# Patient Record
Sex: Female | Born: 1995 | Hispanic: Yes | Marital: Single | Smoking: Former smoker
Health system: Southern US, Community
[De-identification: ages and names within clinical notes are randomized; demographics above are authoritative.]

## PROBLEM LIST (undated history)

## (undated) DIAGNOSIS — J45909 Unspecified asthma, uncomplicated: Secondary | ICD-10-CM

## (undated) HISTORY — PX: NO PAST SURGERIES: SHX2092

---

## 2017-12-27 ENCOUNTER — Other Ambulatory Visit (HOSPITAL_COMMUNITY): Payer: Self-pay | Admitting: Obstetrics and Gynecology

## 2017-12-27 ENCOUNTER — Encounter (HOSPITAL_COMMUNITY): Payer: Self-pay

## 2017-12-27 DIAGNOSIS — O283 Abnormal ultrasonic finding on antenatal screening of mother: Secondary | ICD-10-CM

## 2017-12-27 DIAGNOSIS — Z3A12 12 weeks gestation of pregnancy: Secondary | ICD-10-CM

## 2017-12-28 ENCOUNTER — Ambulatory Visit (HOSPITAL_COMMUNITY)
Admission: RE | Admit: 2017-12-28 | Discharge: 2017-12-28 | Disposition: A | Discharge status: Home / Self Care | Payer: Medicaid Other | Source: Ambulatory Visit | Attending: Obstetrics and Gynecology | Admitting: Obstetrics and Gynecology

## 2017-12-28 ENCOUNTER — Other Ambulatory Visit: Payer: Self-pay

## 2017-12-28 ENCOUNTER — Encounter (HOSPITAL_COMMUNITY): Payer: Self-pay

## 2017-12-28 DIAGNOSIS — Z3A12 12 weeks gestation of pregnancy: Secondary | ICD-10-CM | POA: Insufficient documentation

## 2017-12-28 DIAGNOSIS — O283 Abnormal ultrasonic finding on antenatal screening of mother: Secondary | ICD-10-CM

## 2017-12-28 DIAGNOSIS — O289 Unspecified abnormal findings on antenatal screening of mother: Secondary | ICD-10-CM | POA: Diagnosis present

## 2017-12-28 HISTORY — DX: Unspecified asthma, uncomplicated: J45.909

## 2017-12-28 NOTE — Progress Notes (Signed)
Genetic Counseling  High-Risk Gestation Note  Appointment Date:  12/28/2017 Referred By: Marylen Ponto, DO Date of Birth:  28-Dec-1995 Partner:  Tabitha Henry   Pregnancy History: [id number] Estimated Date of Delivery: 07/08/18 Estimated Gestational Age: [redacted]w[redacted]d Attending: Ledon Snare, MD   Ms. Tabitha Henry and her partner, Mr. Tabitha Henry, were seen for genetic counseling because of increased nuchal translucency on ultrasound.     In summary:  Reviewed ultrasound findings and the association with fetal aneuploidy  NT measurement approximately 4.2 mm at [facility] of 68  Discussed significance of prior screening for fetal aneuploidy  NIPS for aneuploidy (InformaSeq) drawn through OB office on 12/25/17- currently pending  Discussed additional associations with NT including structural defect, single gene conditions, infections, and normal variant  Offered additional screening  NIPS for single gene conditions (Vistara)- couple elected to pursue this at today's visit  Expanded carrier screening- patient elected to pursue expanded panel through [facility] laboratory today  Detailed ultrasound- scheduled 02/08/18  Fetal Echocardiogram- not scheduled at this [facility]  Discussed option of diagnostic testing  [facility]- declined  Amniocentesis- declined today but may consider pending results of screening tests  Reviewed family history concerns  Ms. Tabitha Henry was referred given that increased nuchal translucency was observed in her primary OB office on 12/25/17, and noninvasive prenatal screening (NIPS)/prenatal cell free DNA testing, specifically InformaSeq, was drawn at that [facility].   Ultrasound was performed today and also visualized increased nuchal translucency measurement of 4.2 mm at CRL of 62 mm. No evidence of hydrops on today's exam. Ultrasound today otherwise within normal limits. Complete ultrasound report under separate cover.   We discussed that the fetal NT refers to a  fluid filled space between the skin and soft tissues behind the cervical spine. This space is traditionally measurable between 11 and 13.[redacted] weeks gestation and is considered enlarged when the measurement is equal to or greater than the 95th percentile for the gestational age. This couple was counseled regarding the various common etiologies for an enlarged NT including: aneuploidy, single gene conditions, cardiac or great vessel abnormalities, lymphatic system failure, decreased fetal movement, and fetal anemia.   We reviewed chromosomes, nondisjunction, and the common features and prognoses of Down Henry and other aneuploidies. Considering Ms. Tabitha Henry's age, an NT of 4.2 mm and a CRL of 62 mm, the risk for fetal aneuploidy is estimated to be approximately 11%. Results of the patient's NIPS are currently pending. We reviewed the methodology of NIPS, the conditions for which InformaSeq assesses and the sensitivity and specificity of each. They understand that while highly sensitive and specific, it is not considered to be diagnostic and does not assess for all chromosome conditions. We reviewed the benefits and limitations of this screen. We also discussed the chance for other underlying chromosome aberrations associated with increased NT, such as deletions or duplications.   They were also counseled regarding diagnostic testing via [facility] and amniocentesis for karyotype and chromosome microarray analysis. They were counseled that microarray analysis is a molecular based technique in which a test sample of DNA (fetal) is compared to a reference (normal) genome in order to determine if the test sample has any extra or missing genetic information.  Microarray analysis allows for the detection of genetic deletions and duplications that are 100 times smaller than those identified by routine chromosome analysis.They were counseled that chromosome microarray analysis detects a genomic copy number variant  (CNV) in ~6% of fetuses with an abnormal ultrasound finding(s) and a normal  karyotype; however, the significance of that CNV in some cases may not be known. We reviewed the associated 1 in 100 risks for complications for [facility] and associated 1 in 300-500 risk for complications for amniocentesis, including spontaneous pregnancy loss. Ms. Tabitha Henry. However, the couple indicated that they would consider pursuing amniocentesis pending results of screening tests in the pregnancy.   We discussed the possible results that the tests might provide including: positive, negative, unanticipated, and no result. Finally, they were counseled regarding the cost of each option and potential out of pocket expenses.   In addition, we discussed that an NT of 4.2 mm is associated with an approximate 3% risk for a fetal cardiac anomaly. We discussed the options of a fetal echocardiogram and detailed anatomy ultrasound as methods to evaluate the fetal heart. Detailed ultrasound is scheduled for 02/08/18. Fetal echocardiogram was not yet ordered/scheduled.    We also discussed single gene conditions and various patterns of inheritance including autosomal dominant (either inherited or de novo) and autosomal recessive. They were counseled that an increased NT is associated with an increased chance for specific single gene conditions including Noonan spectrum disorders, skeletal dysplasias, Tabitha Henry, Tabitha Henry, and many others. We discussed that these conditions are not routinely tested for prenatally unless ultrasound findings or family history significantly increase the suspicion of a specific single gene disorder. However, there is new noninvasive prenatal screening (NIPS) technology which assesses for specific alterations in 30 single genes, including the genes associated with Noonan Henry, some skeletal dysplasias, and Tabitha Henry. We discussed that this testing,  marketed as [facility] merchandiserVistara through [facility] laboratory, is available. We reviewed the methodology and that both maternal and paternal sample is required for this testing to be performed. We reviewed the benefits and limitations. They understand that many of the features of these conditions can be detected by detailed ultrasound during the second trimester; however, ultrasound cannot definitively diagnose or rule out these conditions. After careful consideration, Ms. Tabitha Henry elected to proceed with Vistara at today's visit. Turnaround Henry is approximately 2-3 weeks.   Regarding assessing for autosomal recessive and some X-linked single gene conditions, we discussed the option of expanded carrier screening. Regarding single gene conditions, we discussed autosomal dominant, autosomal recessive, and X-linked conditions. We discussed that prenatal screening and testing is not typically available for all single gene conditions associated with increased nuchal translucency. We discussed the option of carrier screening for a select panel of autosomal recessive and some X-linked conditions, some of which but not all can have increased NT as an associated feature. ACOG currently recommends that all patients be offered carrier screening for cystic fibrosis, spinal muscular atrophy and hemoglobinopathies. In addition, they were counseled that there are a variety of genetic screening laboratories that have pan-ethnic, or expanded, carrier screening panels, which evaluate carrier status for a wide range of genetic conditions. Some of these conditions are severe and actionable, but also rare; others occur more commonly, but are less severe. We discussed that testing options range from screening for a single condition to panels of more than 200 autosomal or X-linked genetic conditions. The prevalence of each condition varies (and often varies with ethnicity). Thus the couples' background risk to be a carrier for each of these  various conditions would range, and in some cases be very low or unknown. We reviewed that a negative carrier screen would thus reduce, but not eliminate the chance to be a carrier for  these conditions. For some conditions included on specific pan-ethnic carrier screening panels, the phenotype may not yet be well defined. For the majority of conditions on pan-ethnic carrier screening panels, identification of carrier status is not expected to be associated with medical features for the carrier; However, there are currently few exceptions where carrier status has been shown to increase the chance for certain medical concerns. We reviewed that in the event that one partner is found to be a carrier for one or more conditions, carrier screening would be available to the partner for those conditions. We discussed the risks, benefits, and limitations of carrier screening with the couple. After thoughtful consideration of their options, Ms. Katielynn Horan elected to pursue expanded pan-ethnic carrier screening (including ACOG recommended panel) at this [facility] through [facility] laboratory; the panel includes 175 single gene conditions.   We also discussed that an increased NT value can be a normal variant, which can resolve during the pregnancy. They were counseled that the fetal prognosis depends on the underlying etiology of the enlarged NT and further anticipatory guidance can be provided if a diagnosis is discovered. They understand that the legal limit for TAB in [place] is [redacted] weeks gestation.   Both family histories were reviewed and found to be noncontributory for birth defects, intellectual disability, and known genetic conditions. The couple have an 32 month old daughter together, who is reportedly healthy. The couple each reported [place] ancestry. Consanguinity was denied for the couple. Further genetic counseling is warranted if more information is obtained.  Ms. Rosangelica Pevehouse denied exposure to  environmental toxins or chemical agents. She denied the use of alcohol, tobacco or street drugs. She reported that she had the flu at approximately one month ago, which would be approximately 7-[redacted] weeks gestation. She had a chest x ray at that Henry, with lead shield over her pelvis given that they were aware of the pregnancy. She is unsure of the degree to which her fever went to, and she reported taking Tamiflu.  I counseled this couple regarding the above risks and available options. Most of the counseling was provided by Andrez Grime, [employer] genetic counseling student, under my direct supervision. The approximate face-to-face Henry with the genetic counselor was 45 minutes.   Quinn Plowman, MS Certified Genetic Counselor 12/28/2017

## 2017-12-31 ENCOUNTER — Encounter (HOSPITAL_COMMUNITY): Payer: Self-pay

## 2017-12-31 ENCOUNTER — Other Ambulatory Visit (HOSPITAL_COMMUNITY): Payer: Self-pay | Admitting: *Deleted

## 2017-12-31 DIAGNOSIS — O283 Abnormal ultrasonic finding on antenatal screening of mother: Secondary | ICD-10-CM

## 2018-01-02 ENCOUNTER — Other Ambulatory Visit: Payer: Self-pay

## 2018-01-17 ENCOUNTER — Telehealth (HOSPITAL_COMMUNITY): Payer: Self-pay | Admitting: MS"

## 2018-01-17 NOTE — Telephone Encounter (Signed)
Called Lorane Gell Terrisa Curfman to discuss her prenatal cell free single gene DNA screening results. Ms. ,Chevonne Bostrom and her partner, Geralyn Flash, had Vistara screening through [place] laboratories. Screening was offered because of increased nuchal translucency on ultrasound. The patient was identified by name and DOB. We reviewed that no known pathogenic variants were detected in any of the genes for which screening was performed. We reviewed that the Vistara sequencing panel contains the coding regions of 30 genes, which follow an autosomal dominant or X-linked pattern of inheritance and typically occur due to de novo gene mutations. The detection rates for these conditions vary depending upon the specific condition but range from 43% to 96%. Therefore, this screening does not identify all new dominant gene mutations and a residual risk remains. We discussed that additional screening is available via detailed ultrasound, which the patient is scheduled to have.   Ms. Zeniyah Peaster also had expanded carrier screening through [facility]. We reviewed that the results are negative for all of the conditions for which analysis was performed, with the exception of Pendred syndrome.  This indicates that she does not have a detectable gene alteration in any of the genes for which analysis was performed, with the exception of [id number]. We discussed this condition briefly.   She was counseled that Pendred syndrome is an autosomal recessive condition characterized by congenital severe to profound deafness and increased risk for goiter development. We discussed that this condition would not be expected to be related to the ultrasound findings in the current pregnancy. Prior to carrier screening for her partner, the current pregnancy has an approximate 1 in 280 risk for Pendred syndrome, assuming the general population carrier frequency for [place]. We discussed that carrier screening is  available to Mr. Wendee Beavers, if desired. Ms. Venna Berberich plans to discuss this information further and will contact us if she desires [place] to facilitate carrier screening for him.   We reviewed that carrier screening does not detect all carriers of all of these conditions, but a normal result significantly decreases the likelihood of being a carrier, and therefore, the overall reproductive risk. We reviewed that [facility] sequences most of the genes, which is associated with a high detection rate for carriers, thus a negative screen is very reassuring. All questions were answered to her satisfaction, she was encouraged to call with additional questions or concerns.   Chipper Oman, MS Insurance risk surveyor

## 2018-01-17 NOTE — Telephone Encounter (Signed)
Attempted to contact patient regarding results of single gene noninvasive prenatal screening (Vistara) and expanded carrier screening. Patient did not answer and does not have voice mailbox set up. Unable to leave message for patient to call back.   Tabitha BraunKaren Shooter Tangen 01/17/2018 11:50 AM

## 2018-02-08 ENCOUNTER — Other Ambulatory Visit (HOSPITAL_COMMUNITY): Payer: Self-pay | Admitting: Obstetrics and Gynecology

## 2018-02-08 ENCOUNTER — Ambulatory Visit (HOSPITAL_COMMUNITY)
Admission: RE | Admit: 2018-02-08 | Discharge: 2018-02-08 | Disposition: A | Discharge status: Home / Self Care | Payer: Medicaid Other | Source: Ambulatory Visit | Attending: Obstetrics and Gynecology | Admitting: Obstetrics and Gynecology

## 2018-02-08 ENCOUNTER — Encounter (HOSPITAL_COMMUNITY): Payer: Self-pay

## 2018-02-08 DIAGNOSIS — O289 Unspecified abnormal findings on antenatal screening of mother: Secondary | ICD-10-CM | POA: Insufficient documentation

## 2018-02-08 DIAGNOSIS — Z3689 Encounter for other specified antenatal screening: Secondary | ICD-10-CM

## 2018-02-08 DIAGNOSIS — Z3A18 18 weeks gestation of pregnancy: Secondary | ICD-10-CM | POA: Diagnosis not present

## 2018-02-08 DIAGNOSIS — O321XX0 Maternal care for breech presentation, not applicable or unspecified: Secondary | ICD-10-CM | POA: Diagnosis not present

## 2018-02-08 DIAGNOSIS — O283 Abnormal ultrasonic finding on antenatal screening of mother: Secondary | ICD-10-CM

## 2018-02-11 ENCOUNTER — Other Ambulatory Visit (HOSPITAL_COMMUNITY): Payer: Self-pay | Admitting: *Deleted

## 2018-02-11 DIAGNOSIS — Z362 Encounter for other antenatal screening follow-up: Secondary | ICD-10-CM

## 2018-03-08 ENCOUNTER — Ambulatory Visit (HOSPITAL_COMMUNITY)
Admission: RE | Admit: 2018-03-08 | Discharge: 2018-03-08 | Disposition: A | Discharge status: Home / Self Care | Payer: Medicaid Other | Source: Ambulatory Visit | Attending: Obstetrics and Gynecology | Admitting: Obstetrics and Gynecology

## 2018-03-08 ENCOUNTER — Encounter (HOSPITAL_COMMUNITY): Payer: Self-pay

## 2018-03-08 ENCOUNTER — Other Ambulatory Visit (HOSPITAL_COMMUNITY): Payer: Self-pay | Admitting: Obstetrics and Gynecology

## 2018-03-08 DIAGNOSIS — Z3A22 22 weeks gestation of pregnancy: Secondary | ICD-10-CM | POA: Insufficient documentation

## 2018-03-08 DIAGNOSIS — O289 Unspecified abnormal findings on antenatal screening of mother: Secondary | ICD-10-CM

## 2018-03-08 DIAGNOSIS — Z362 Encounter for other antenatal screening follow-up: Secondary | ICD-10-CM | POA: Insufficient documentation

## 2018-03-08 DIAGNOSIS — O283 Abnormal ultrasonic finding on antenatal screening of mother: Secondary | ICD-10-CM

## 2018-03-11 ENCOUNTER — Other Ambulatory Visit (HOSPITAL_COMMUNITY): Payer: Self-pay | Admitting: *Deleted

## 2018-03-11 DIAGNOSIS — O283 Abnormal ultrasonic finding on antenatal screening of mother: Secondary | ICD-10-CM

## 2018-03-28 ENCOUNTER — Encounter (HOSPITAL_COMMUNITY): Payer: Self-pay

## 2018-04-12 ENCOUNTER — Ambulatory Visit (HOSPITAL_COMMUNITY)
Admission: RE | Admit: 2018-04-12 | Discharge: 2018-04-12 | Disposition: A | Discharge status: Home / Self Care | Payer: Medicaid Other | Source: Ambulatory Visit | Attending: Obstetrics and Gynecology | Admitting: Obstetrics and Gynecology

## 2018-04-24 ENCOUNTER — Ambulatory Visit (HOSPITAL_COMMUNITY)
Admission: RE | Admit: 2018-04-24 | Discharge: 2018-04-24 | Disposition: A | Discharge status: Home / Self Care | Payer: Medicaid Other | Source: Ambulatory Visit | Attending: Obstetrics and Gynecology | Admitting: Obstetrics and Gynecology

## 2018-04-24 ENCOUNTER — Encounter (HOSPITAL_COMMUNITY): Payer: Self-pay

## 2018-04-24 DIAGNOSIS — O283 Abnormal ultrasonic finding on antenatal screening of mother: Secondary | ICD-10-CM | POA: Insufficient documentation

## 2018-09-30 ENCOUNTER — Encounter (HOSPITAL_COMMUNITY): Payer: Self-pay

## 2019-03-12 IMAGING — US US MFM OB FOLLOW-UP
1 series · 14 of 28 positions shown · non-contrast
Comparison: none

[Series 1: us mfm ob follow-up · 14 of 73 slices shown]
[im 3/73]
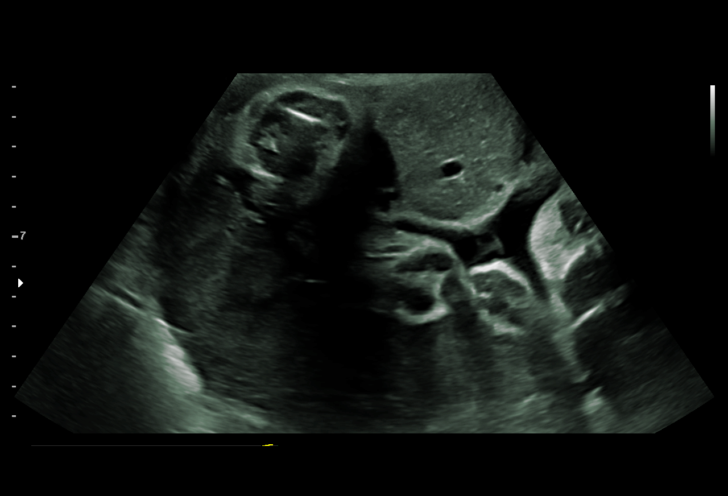
[im 9/73]
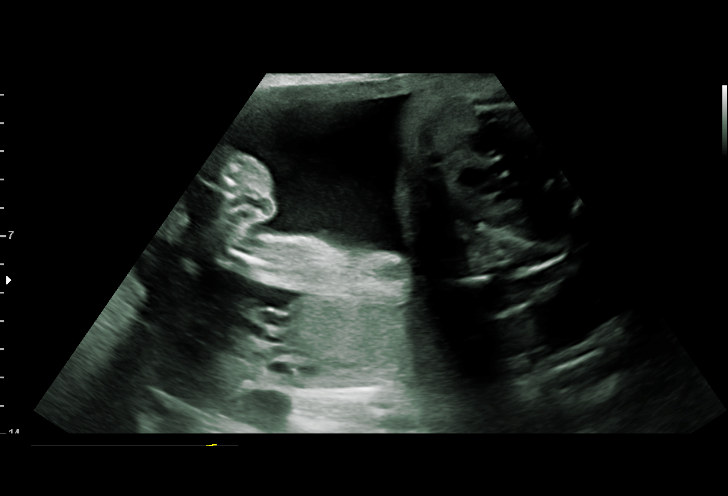
[im 14/73]
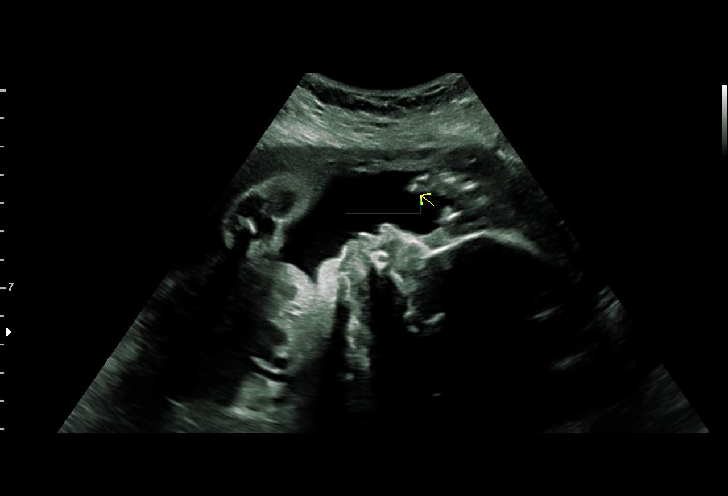
[im 19/73]
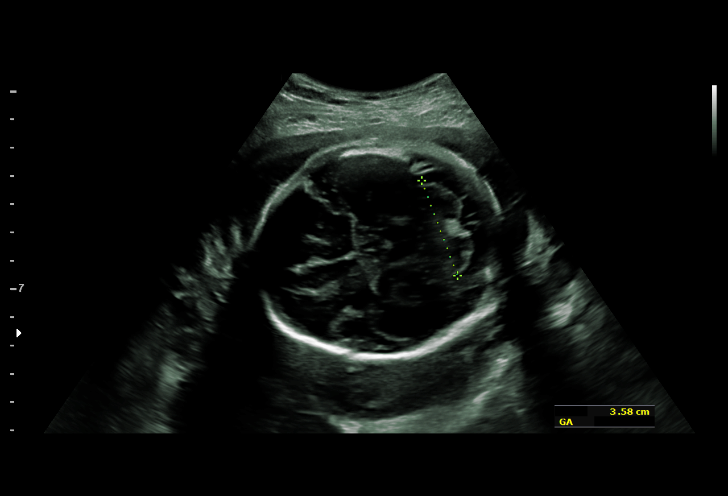
[im 25/73]
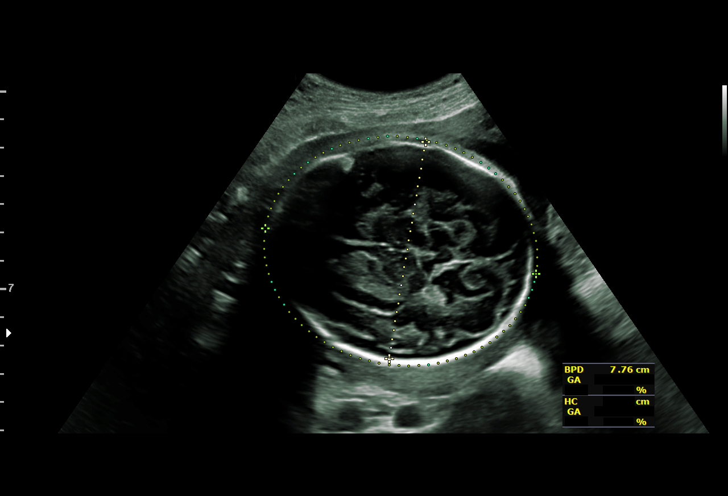
[im 30/73]
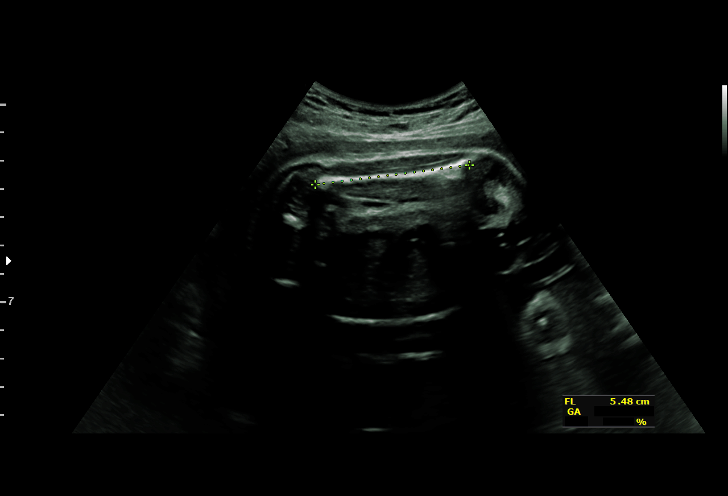
[im 35/73]
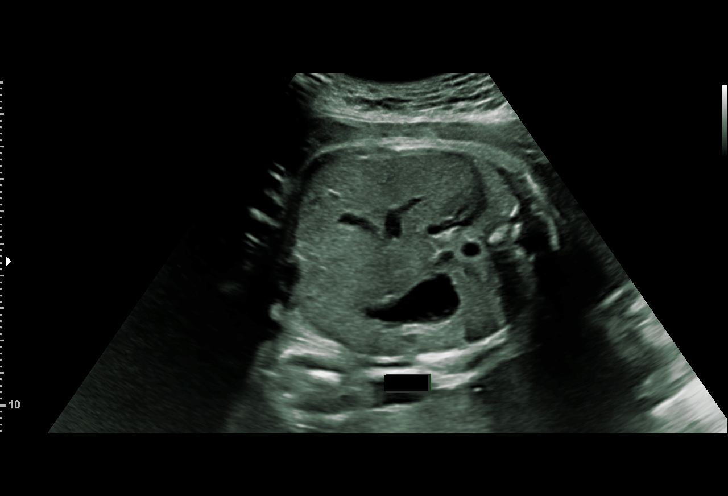
[im 41/73]
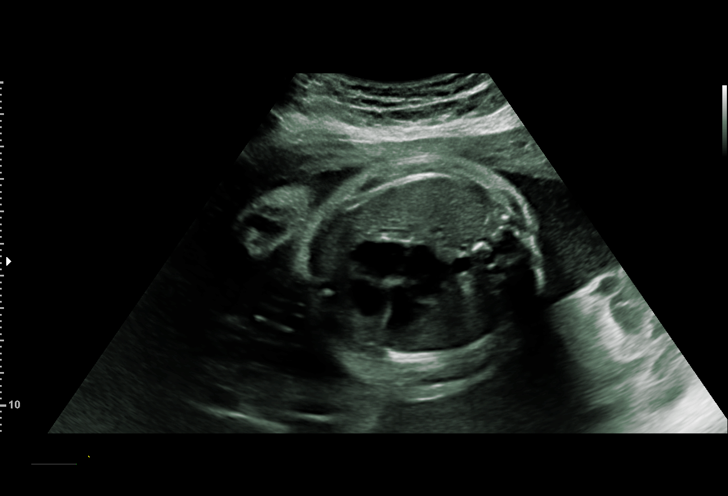
[im 46/73]
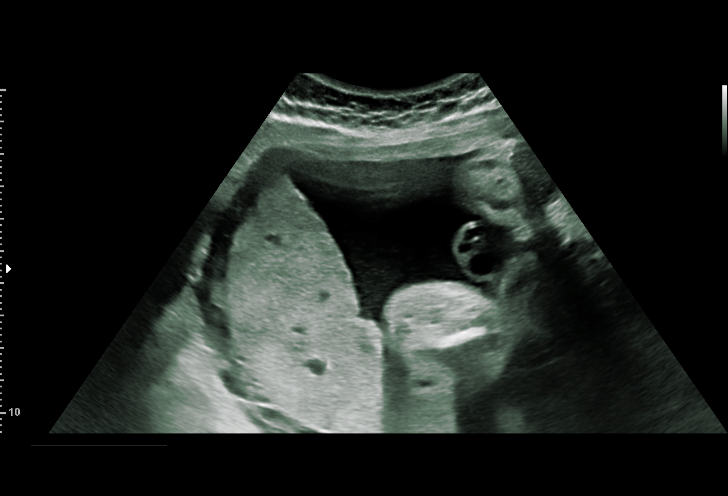
[im 51/73]
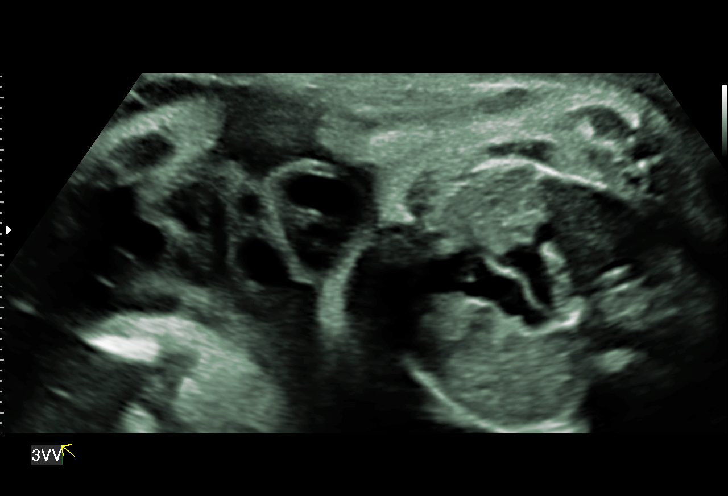
[im 57/73]
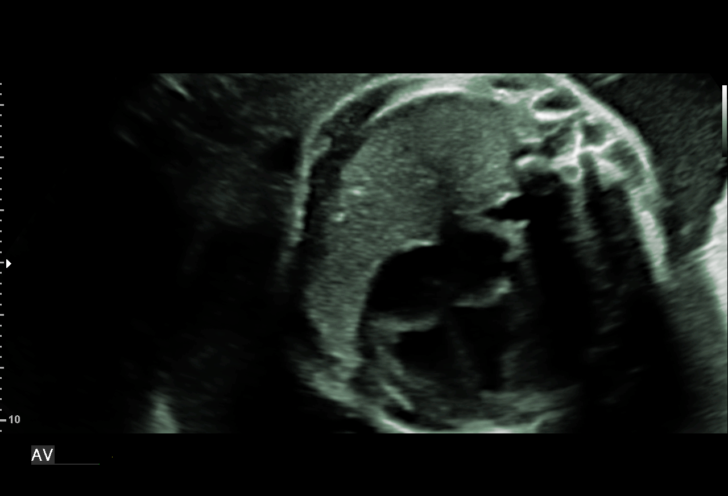
[im 62/73]
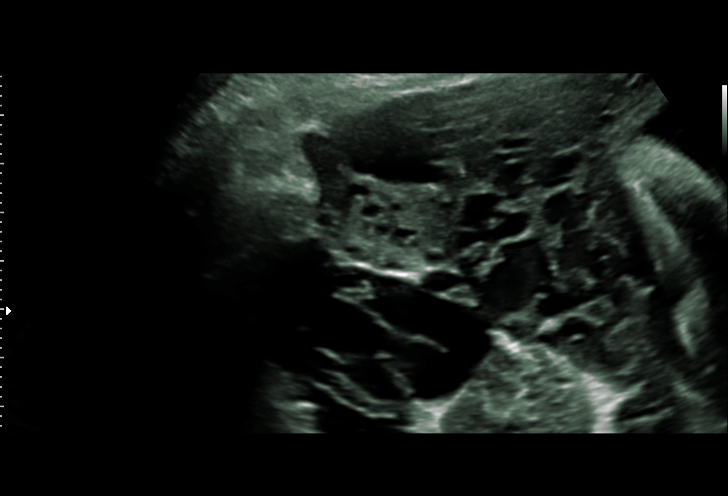
[im 67/73]
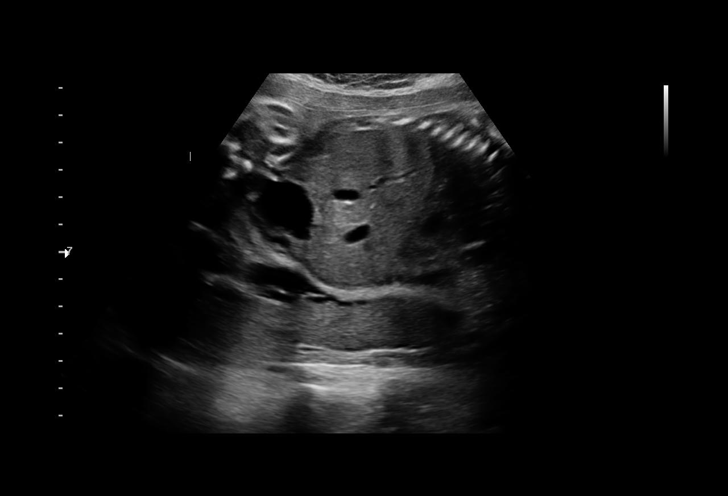
[im 73/73]
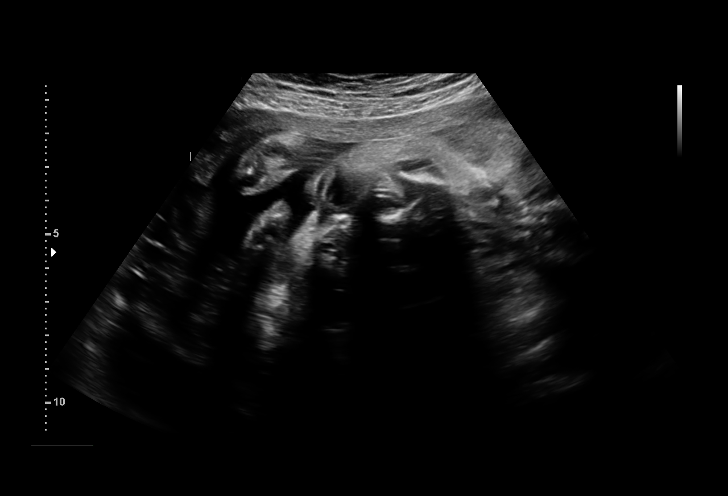

[14 of 28 positions shown; findings below may reference images not displayed]

ANDERS

LAQUITA DO

Indications

29 weeks gestation of pregnancy
Abnormal first trimester screen (NT)
(thickened, 4.23 mm)
Encounter for other antenatal screening
follow-up
Fetal echo 5-9, normal. No need for follow
up.
OB History

Gravidity:    2         Term:   1        Prem:   0        SAB:   0
TOP:          0       Ectopic:  0        Living: 1
Fetal Evaluation

Num Of Fetuses:     1
Fetal Heart         135
Rate(bpm):
Cardiac Activity:   Observed
Presentation:       Cephalic
Placenta:           Posterior, above cervical os

Amniotic Fluid
AFI FV:      Subjectively within normal limits

AFI Sum(cm)     %Tile       Largest Pocket(cm)
14.65           51

RUQ(cm)       RLQ(cm)       LUQ(cm)        LLQ(cm)
3.85
Biometry

BPD:      76.9  mm     G. Age:  30w 6d         84  %    CI:        77.26   %    70 - 86
FL/HC:      20.0   %    19.6 -
HC:       277   mm     G. Age:  30w 2d         46  %    HC/AC:      1.09        0.99 -
AC:       253   mm     G. Age:  29w 4d         50  %    FL/BPD:     71.9   %    71 - 87
FL:       55.3  mm     G. Age:  29w 1d         32  %    FL/AC:      21.9   %    20 - 24
HUM:      46.9  mm     G. Age:  27w 4d         12  %
CER:      35.8  mm     G. Age:  30w 6d         75  %

LV:        4.5  mm
CM:        6.9  mm

Est. FW:    6868  gm      3 lb 2 oz     57  %
Gestational Age

U/S Today:     30w 0d                                        EDD:   07/03/18
Best:          29w 2d     Det. By:  Early Ultrasound         EDD:   07/08/18
(11/27/17)
Anatomy

Cranium:               Appears normal         Aortic Arch:            Previously seen
Cavum:                 Appears normal         Ductal Arch:            Previously seen
Ventricles:            Appears normal         Diaphragm:              Appears normal
Choroid Plexus:        Appears normal         Stomach:                Appears normal, left
sided
Cerebellum:            Appears normal         Abdomen:                Appears normal
Posterior Fossa:       Appears normal         Abdominal Wall:         Previously seen
Nuchal Fold:           Previously seen        Cord Vessels:           Appears normal (3
vessel cord)
Face:                  Appears normal         Kidneys:                Appear normal
(orbits and profile)
Lips:                  Appears normal         Bladder:                Appears normal
Thoracic:              Appears normal         Spine:                  Previously seen
Heart:                 Appears normal         Upper Extremities:      Previously seen
(4CH, axis, and
situs)
RVOT:                  Appears normal         Lower Extremities:      Previously seen
LVOT:                  Appears normal

Other:  Fetus appears to be a female. Heels previously visualized.
Technically difficult due to fetal position.
Cervix Uterus Adnexa

Cervix
Not visualized (advanced GA >36wks)

Left Ovary
Previously seen.

Right Ovary
Within normal limits.
Comments

Normal fetal echocardiogram 03/28/18.
Impression

Single living intrauterine pregnancy at 29w 2d.
Cephalic presentation.
Placenta Posterior, above cervical os.
Normal amniotic fluid volume.
Appropriate interval fetal growth.
Normal interval fetal anatomy.
Recommendations

Follow-up ultrasounds as clinically indicated.
